# Patient Record
Sex: Male | Born: 1990 | Race: White | Hispanic: No | Marital: Married | Smoking: Never smoker
Health system: Southern US, Community
[De-identification: ages and names within clinical notes are randomized; demographics above are authoritative.]

---

## 2015-11-18 ENCOUNTER — Ambulatory Visit (INDEPENDENT_AMBULATORY_CARE_PROVIDER_SITE_OTHER): Payer: Self-pay | Admitting: Family Medicine

## 2015-11-18 VITALS — BP 130/80 | HR 88 | Temp 98.9°F | Resp 18 | Ht 74.0 in | Wt 210.8 lb

## 2015-11-18 DIAGNOSIS — Z021 Encounter for pre-employment examination: Secondary | ICD-10-CM

## 2015-11-18 LAB — POCT URINALYSIS DIP (MANUAL ENTRY)
BILIRUBIN UA: NEGATIVE
BILIRUBIN UA: NEGATIVE
GLUCOSE UA: NEGATIVE
Leukocytes, UA: NEGATIVE
Nitrite, UA: NEGATIVE
Protein Ur, POC: NEGATIVE
RBC UA: NEGATIVE
SPEC GRAV UA: 1.01
UROBILINOGEN UA: 0.2
pH, UA: 6

## 2015-11-18 NOTE — Patient Instructions (Signed)
Return on Thursday for interpretation of PPD test.    Return at any time if we can be of assistance

## 2015-11-18 NOTE — Progress Notes (Signed)
Patient ID: Ricardo Foster, male    DOB: [birth year]  Age: 24 y.o. MRN: [id number]  Chief Complaint  Patient presents with  . Annual Exam    Subjective:   24 year old man who is here for a physical examination. 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 Before going to the training program he needs a physical exam. Apparently he will end up having another physical exam next week also.  Past history: Operations: None Major illnesses: None Allergies: Penicillin Medications: None  Family history: Parents are living and well as are his 2 brothers. Father has high cholesterol.  Social history: He is single, currently working in a bank, does not smoke or use drugs. He has been using alcohol about twice a week. He does a lot of regular physical exercise  Review of systems: Unremarkable   Current allergies, medications, problem list, past/family and social histories reviewed.  Objective:  BP 130/80 mmHg  Pulse 88  Temp(Src) 98.9 F (37.2 C) (Oral)  Resp 18  Ht 6\' 2"  (1.88 m)  Wt 210 lb 12.8 oz (95.618 kg)  BMI 27.05 kg/m2  SpO2 97%  Physical exam: A well-developed well-nourished young man in no acute distress. TMs normal. Eyes PERRLA. Fundi benign. Throat clear. Neck supple without nodes or thyromegaly. No carotid bruits. Chest is clear to auscultation. Heart regular without murmurs gallops or arrhythmias. Abdomen soft without masses or tenderness. Normal male external genitalia. Right testicle is a little bit atrophic compared to the left. We discussed that. He is been that way all his life. No hernias. Extremities unremarkable. Skin unremarkable. Rectal not done.   Assessment & Plan:   Assessment: 1. Physical exam, pre-employment       Plan:    Plan: Form completed Needs his urine dip Needs his PPD and will need to return in 2-3 days to have it reviewed   Orders Placed This Encounter  Procedures  . POCT urinalysis dipstick  . TB Skin Test    Order  Specific Question:  Has patient ever tested positive?    Answer:  No    Patient Instructions  Return on Thursday for interpretation of PPD test.    Return at any time if we can be of assistance     Return if symptoms worsen or fail to improve.   Luz Mares, MD 11/18/2015

## 2015-11-21 ENCOUNTER — Ambulatory Visit (INDEPENDENT_AMBULATORY_CARE_PROVIDER_SITE_OTHER): Payer: Self-pay | Admitting: *Deleted

## 2015-11-21 DIAGNOSIS — Z111 Encounter for screening for respiratory tuberculosis: Secondary | ICD-10-CM

## 2015-11-21 LAB — TB SKIN TEST
INDURATION: 0 mm
TB Skin Test: NEGATIVE

## 2015-11-21 NOTE — Progress Notes (Signed)
   Subjective:    Patient ID: Ricardo Foster, male    DOB: [birth year], 24 y.o.   MRN: [id number]  HPI  Patient here for ppd reading. Negative reading 75mm induration.  Review of Systems     Objective:   Physical Exam        Assessment & Plan:

## 2018-08-16 ENCOUNTER — Other Ambulatory Visit: Payer: Self-pay | Admitting: Nephrology

## 2018-08-16 DIAGNOSIS — R944 Abnormal results of kidney function studies: Secondary | ICD-10-CM

## 2018-08-24 ENCOUNTER — Ambulatory Visit
Admission: RE | Admit: 2018-08-24 | Discharge: 2018-08-24 | Disposition: A | Discharge status: Home / Self Care | Payer: BLUE CROSS/BLUE SHIELD | Source: Ambulatory Visit | Attending: Nephrology | Admitting: Nephrology

## 2018-08-24 DIAGNOSIS — N2889 Other specified disorders of kidney and ureter: Secondary | ICD-10-CM | POA: Insufficient documentation

## 2018-08-24 DIAGNOSIS — R944 Abnormal results of kidney function studies: Secondary | ICD-10-CM | POA: Diagnosis not present

## 2018-09-01 ENCOUNTER — Other Ambulatory Visit: Payer: Self-pay | Admitting: Nephrology

## 2018-09-01 DIAGNOSIS — D1771 Benign lipomatous neoplasm of kidney: Secondary | ICD-10-CM

## 2018-09-15 ENCOUNTER — Ambulatory Visit
Admission: RE | Admit: 2018-09-15 | Discharge: 2018-09-15 | Disposition: A | Discharge status: Home / Self Care | Payer: BLUE CROSS/BLUE SHIELD | Source: Ambulatory Visit | Attending: Nephrology | Admitting: Nephrology

## 2018-09-15 DIAGNOSIS — D1771 Benign lipomatous neoplasm of kidney: Secondary | ICD-10-CM | POA: Diagnosis present

## 2019-01-18 IMAGING — US US RENAL
1 series · 14 of 25 positions shown · non-contrast
Comparison: None.

CLINICAL DATA: Abnormal renal function tests

EXAM:
RENAL / URINARY TRACT ULTRASOUND COMPLETE

[Series 1: us renal · 0.23mm/px · 14 of 31 slices shown]
[im 1/31]
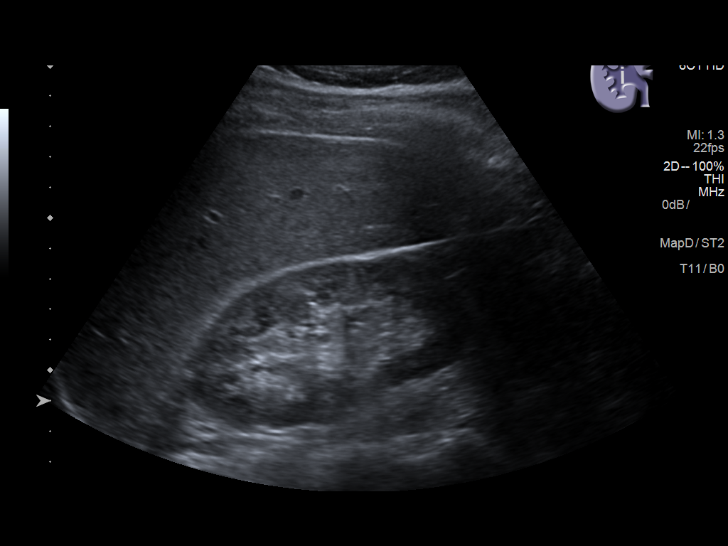
[im 3/31]
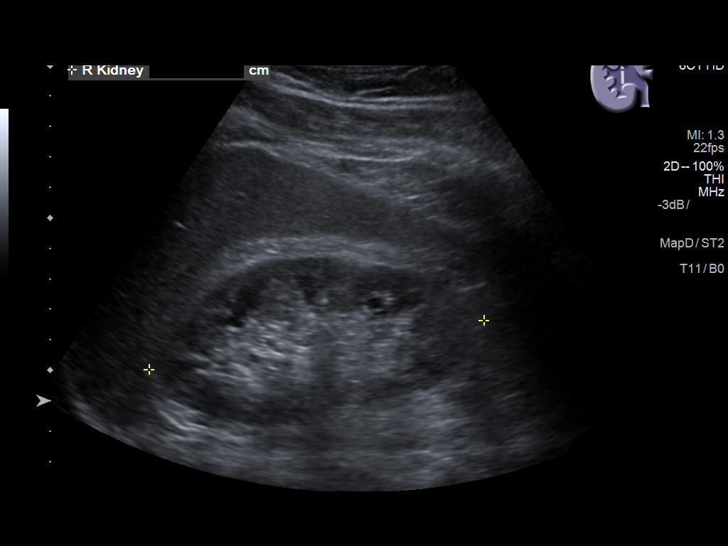
[im 6/31]
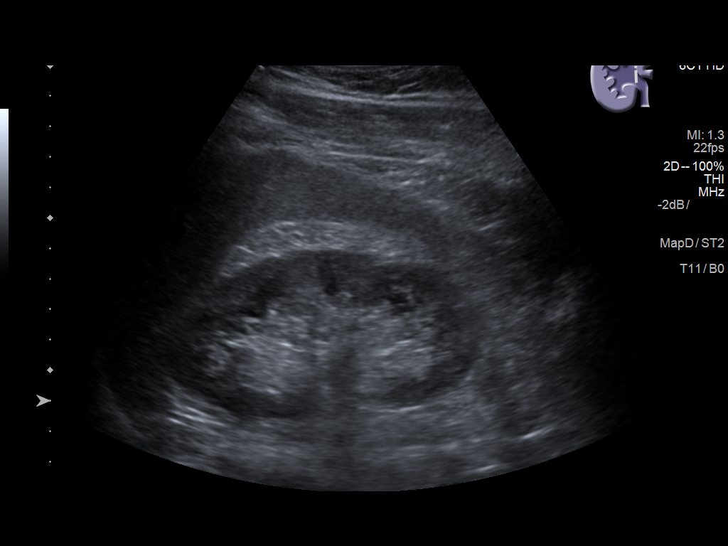
[im 8/31]
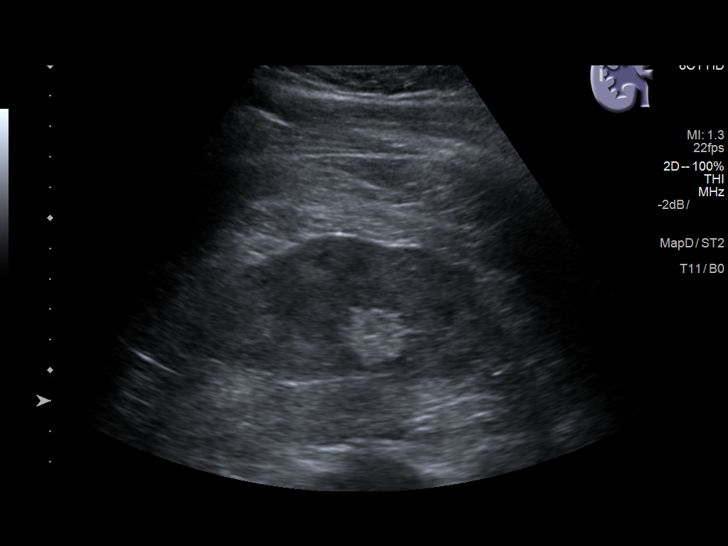
[im 11/31]
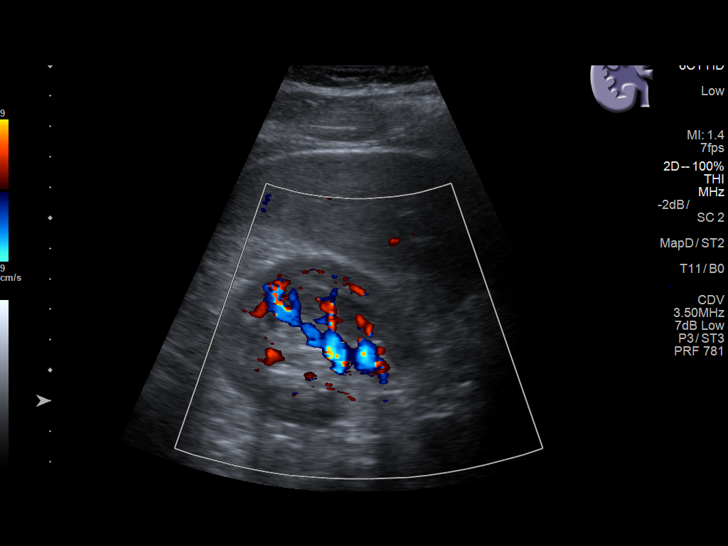
[im 12/31]
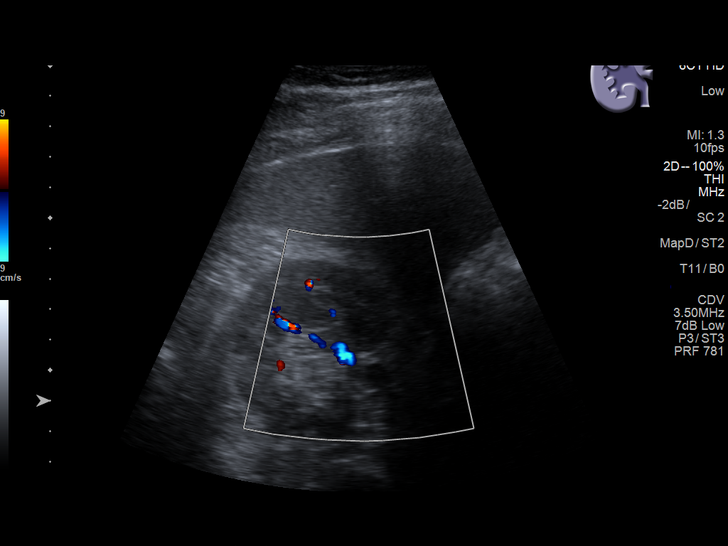
[im 14/31]
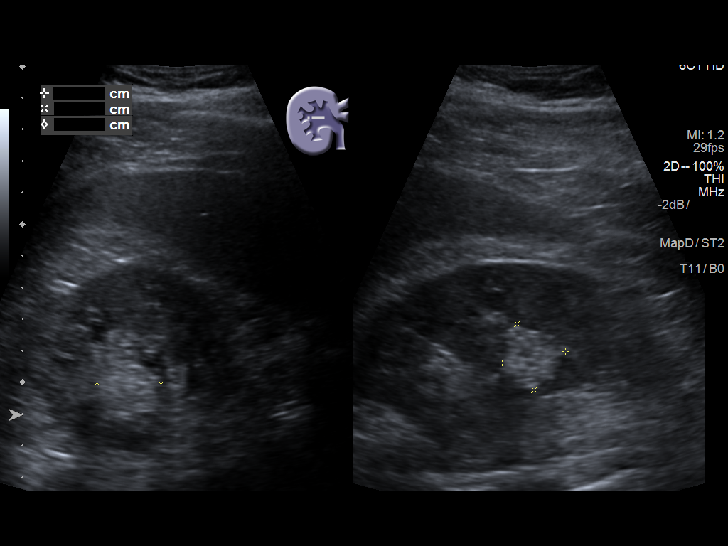
[im 17/31]
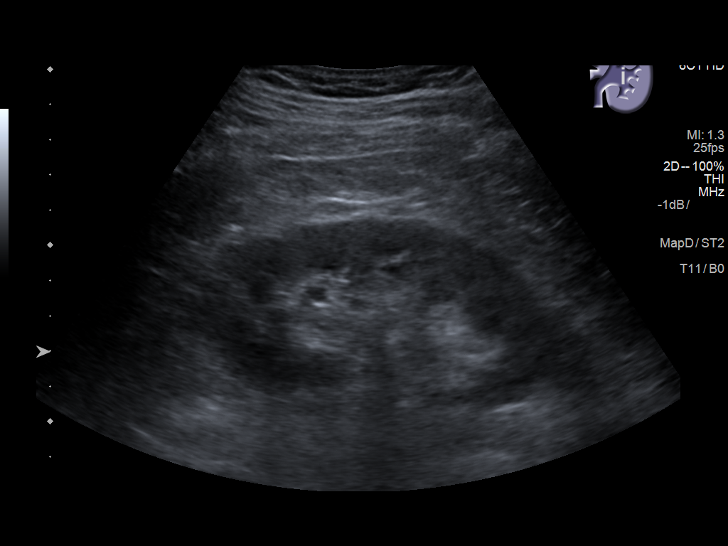
[im 19/31]
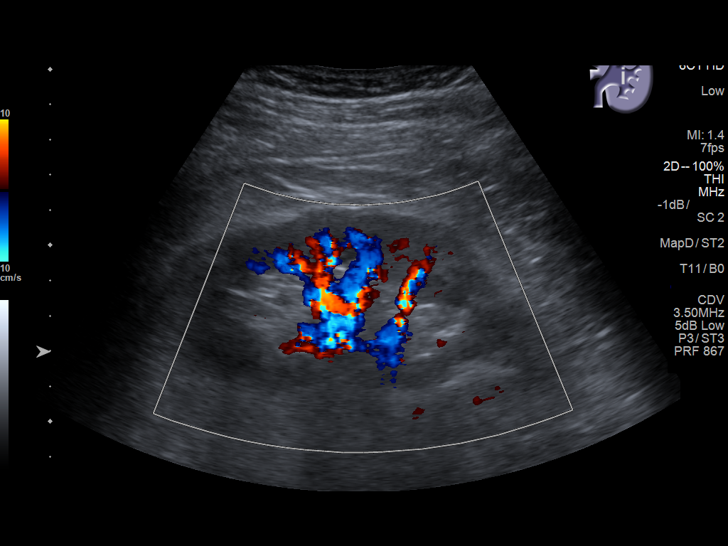
[im 21/31]
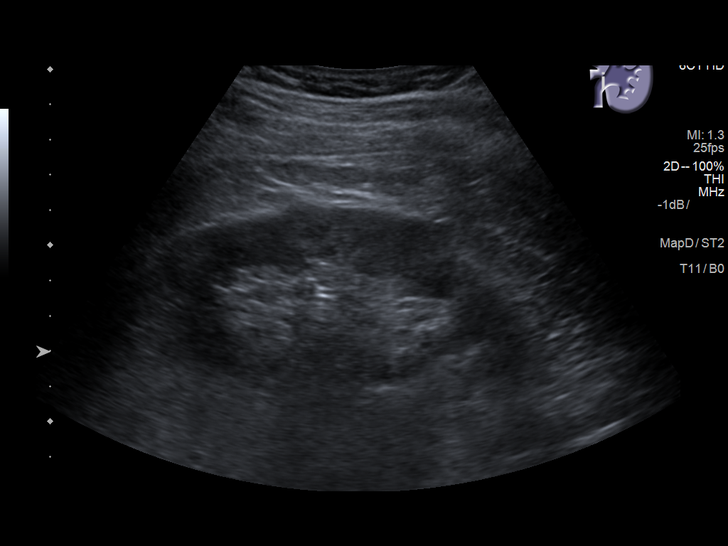
[im 23/31]
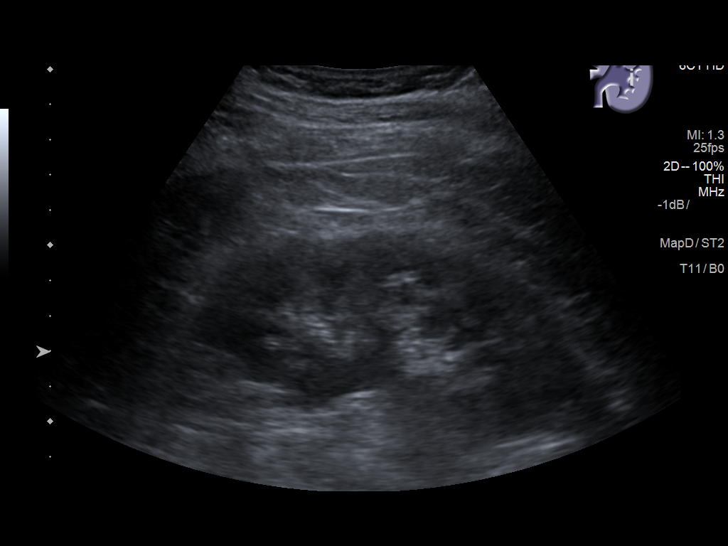
[im 26/31]
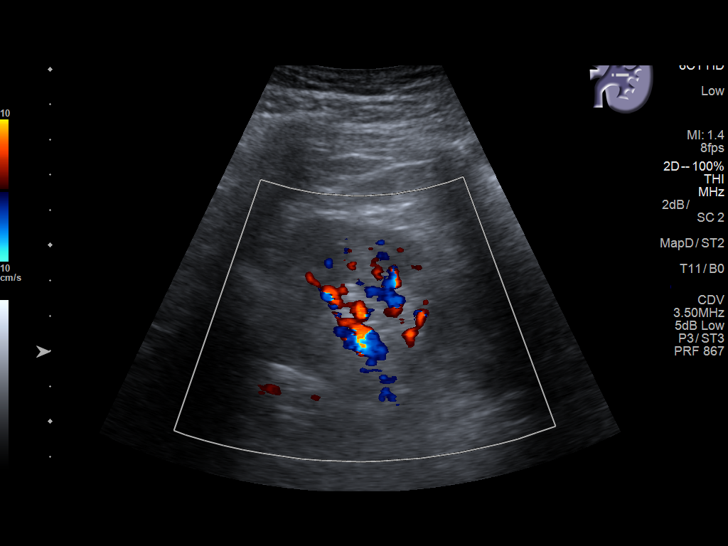
[im 28/31]
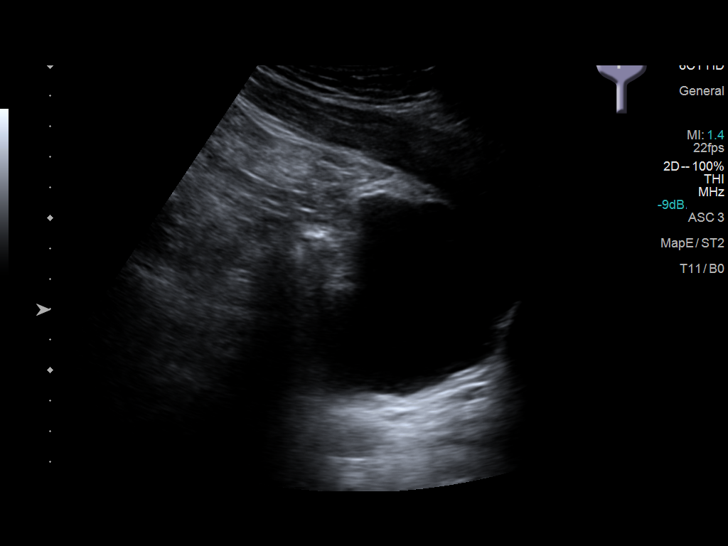
[im 31/31]
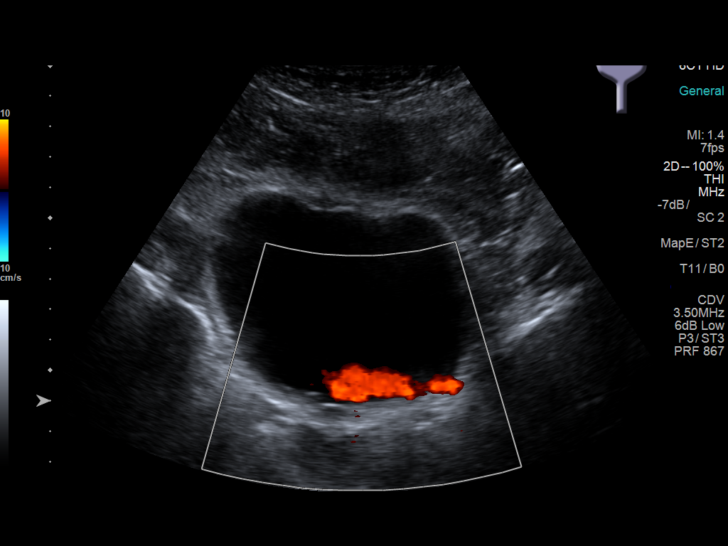

[14 of 25 positions shown; findings below may reference images not displayed]

FINDINGS: Right Kidney:

Length: 11.1 cm. Cortical echogenicity within normal limits. No
hydronephrosis. Hyperechoic mass in the midpole measuring 2 x 2.2 x
2 cm.

Left Kidney:

Length: 10.4 cm. Echogenicity within normal limits. No mass or
hydronephrosis visualized.

Bladder:

Appears normal for degree of bladder distention.
IMPRESSION: 1. Negative for hydronephrosis
2. 2.2 cm hyperechoic mass in the mid pole right kidney, possible
angiomyolipoma. Suggest renal CT or MRI to further evaluate.
